# Patient Record
Sex: Female | Born: 2000
Health system: Southern US, Community
[De-identification: ages and names within clinical notes are randomized; demographics above are authoritative.]

## PROBLEM LIST (undated history)

## (undated) DIAGNOSIS — T7840XA Allergy, unspecified, initial encounter: Secondary | ICD-10-CM

## (undated) DIAGNOSIS — R519 Headache, unspecified: Secondary | ICD-10-CM

## (undated) DIAGNOSIS — F909 Attention-deficit hyperactivity disorder, unspecified type: Secondary | ICD-10-CM

## (undated) HISTORY — DX: Attention-deficit hyperactivity disorder, unspecified type: F90.9

## (undated) HISTORY — DX: Headache, unspecified: R51.9

## (undated) HISTORY — DX: Allergy, unspecified, initial encounter: T78.40XA

## (undated) HISTORY — PX: OPTIC NERVE DECOMPRESSION: SHX365

---
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
## 2022-01-24 ENCOUNTER — Ambulatory Visit: Payer: 59 | Admitting: Allergy

## 2022-07-03 ENCOUNTER — Telehealth: Payer: Self-pay | Admitting: Family Medicine

## 2022-07-03 ENCOUNTER — Telehealth: Payer: Self-pay

## 2022-07-03 NOTE — Telephone Encounter (Signed)
Norma Cooke's mom called the nurseline requesting an ADHD diagnosis report for her study abroad for her school. She now has a new PCP and was last seen by [place] in 2023. Is this something we can help her with?

## 2022-07-03 NOTE — Telephone Encounter (Signed)
Norma Cooke is needing a letter stating her ADHD diagnosis and accommodations needed. She contacted the office of the Dr. (Dr. Nani Gasser officially diagnosed her with ADHD, however that provider has since left and she was informed to contact her PCP. She asked them althought that office has her official diagnosis information she would still need to contact Dr. Claiborne Billings and they told her yes. . She is asking if Dr. Claiborne Billings or another approved provider can provide a letter for her university so that she can travel/ study aboard. Her current accommodations her university provides are double time for testing , food, water, breaks as needed and medication. She also has the ability to access lecture notes or slides prior to the lesson being taught. She is needing this information before May 31st. I informed the patient unfortunately Dr. Claiborne Billings is out of the office this week, but I would send this message to see if anything can be done to accommodate the deadline.

## 2022-07-03 NOTE — Telephone Encounter (Signed)
Spoke with patient regarding results/recommendations. Advised pt that she may be able to use her last office note and previous letter dated 06/2021 until we get approval from PCP to write a new letter with specifics.  Please advise if okay to form new letter

## 2022-07-05 ENCOUNTER — Ambulatory Visit: Payer: 59 | Admitting: Family Medicine

## 2022-07-05 ENCOUNTER — Telehealth: Payer: Self-pay

## 2022-07-05 ENCOUNTER — Encounter: Payer: Self-pay | Admitting: Family Medicine

## 2022-07-05 VITALS — BP 90/64 | HR 87 | Temp 98.7°F | Ht 67.0 in | Wt 132.5 lb

## 2022-07-05 DIAGNOSIS — B889 Infestation, unspecified: Secondary | ICD-10-CM | POA: Diagnosis not present

## 2022-07-05 MED ORDER — TRIAMCINOLONE ACETONIDE 0.1 % EX CREA: 1.0000 | TOPICAL_CREAM | Freq: Two times a day (BID) | CUTANEOUS | 0 refills | Status: DC

## 2022-07-05 NOTE — Progress Notes (Signed)
   Acute Office Visit   Subjective:  Patient ID: Norma Cooke, female    DOB: 2000-10-20, 22 y.o.   MRN: [id number]  Chief Complaint  Patient presents with   Rash    Pt is here today with bites Pt reports she was sitting on sofa at coffee shop. Pt reports this are itching  Showed up 07/04/2022 Pt reports she with friend and both of them have the same bites    HPI Patient is a 22 year old female that presents with bites and hives that are itching. She reports she slept on sofa with a friend and both of them have the same rash.   Symptoms started on 07/04/2022.  Review of Systems  Skin:  Positive for rash.   See HPI above      Objective:   BP 90/64   Pulse 87   Temp 98.7 F (37.1 C)   Ht 5\' 7"  (1.702 m)   Wt 132 lb 8 oz (60.1 kg)   SpO2 98%   BMI 20.75 kg/m    Physical Exam Vitals reviewed.  Constitutional:      General: She is not in acute distress.    Appearance: Normal appearance. She is not ill-appearing, toxic-appearing or diaphoretic.  Eyes:     General:        Right eye: No discharge.        Left eye: No discharge.     Conjunctiva/sclera: Conjunctivae normal.  Cardiovascular:     Rate and Rhythm: Normal rate and regular rhythm.     Heart sounds: Normal heart sounds. No murmur heard.    No friction rub. No gallop.  Pulmonary:     Effort: Pulmonary effort is normal. No respiratory distress.     Breath sounds: Normal breath sounds.  Musculoskeletal:        General: Normal range of motion.  Skin:    General: Skin is warm and dry.     Findings: Rash (Large inflammed whelps in a linear line or cluster on her left posterior upper arm, left lower back, and a few scattered on her upper leg.) present.  Neurological:     General: No focal deficit present.     Mental Status: She is alert and oriented to person, place, and time. Mental status is at baseline.  Psychiatric:        Mood and Affect: Mood normal.        Behavior: Behavior normal.        Thought  Content: Thought content normal.        Judgment: Judgment normal.       Assessment & Plan:  Parasitic dermatitis -     Triamcinolone Acetonide; Apply 1 Application topically 2 (two) times daily.  Dispense: 30 g; Refill: 0  -Prescribed Triamcinolone cream. Apply to the area twice a day.  -Recommend to clean all sheets and clothing in warm water.  -May continue with Zyrtec as an anti-histamine.   Zandra Abts, NP

## 2022-07-05 NOTE — Patient Instructions (Addendum)
-  START Triamcinolone cream. Apply to the area twice a day.  -Recommend to clean all sheets and clothing in warm water.  -May continue with Zyrtec as an anti-histamine.

## 2022-07-05 NOTE — Telephone Encounter (Signed)
Mom lvm requesting a copy of [name]'s ADHD dx. This is needed ASAP. Please call mom re: pickup.

## 2022-07-08 ENCOUNTER — Ambulatory Visit (INDEPENDENT_AMBULATORY_CARE_PROVIDER_SITE_OTHER): Payer: 59 | Admitting: Family Medicine

## 2022-07-08 ENCOUNTER — Encounter: Payer: Self-pay | Admitting: Family Medicine

## 2022-07-08 VITALS — BP 107/70 | HR 86 | Temp 98.5°F | Wt 132.8 lb

## 2022-07-08 DIAGNOSIS — F419 Anxiety disorder, unspecified: Secondary | ICD-10-CM | POA: Diagnosis not present

## 2022-07-08 DIAGNOSIS — F0781 Postconcussional syndrome: Secondary | ICD-10-CM | POA: Diagnosis not present

## 2022-07-08 DIAGNOSIS — F900 Attention-deficit hyperactivity disorder, predominantly inattentive type: Secondary | ICD-10-CM | POA: Diagnosis not present

## 2022-07-08 MED ORDER — METHYLPHENIDATE HCL 20 MG PO TABS: 20.0000 mg | ORAL_TABLET | Freq: Two times a day (BID) | ORAL | 0 refills | Status: DC

## 2022-07-08 MED ORDER — SERTRALINE HCL 100 MG PO TABS: 100.0000 mg | ORAL_TABLET | Freq: Two times a day (BID) | ORAL | 1 refills | Status: DC

## 2022-07-08 NOTE — Progress Notes (Signed)
Patient ID: Norma Cooke, female  DOB: 04/26/2000, 22 y.o.   MRN: [id number] Patient Care Team    Relationship Specialty Notifications Start End  Natalia Leatherwood, DO PCP - General Family Medicine  09/28/21   Andrena Mews, DO Referring Physician Sports Medicine  09/28/21   Fredrich Birks, OD Referring Physician Optometry  09/28/21   Tonie Griffith, MD Referring Physician Neurology  09/28/21   Marcelyn Bruins, MD Consulting Physician Allergy  09/28/21   Owens Shark, MD Consulting Physician Pediatrics  09/28/21     Chief Complaint  Patient presents with   Anxiety    Adhd; needs letter for customs for study abroad     Subjective: Norma Cooke is a 22 y.o.  female present for [facility] follow up ADHD/anxiety: She has been prescribed methylphenidate 20 mg twice daily and would like to continue.  She has settled into college in [place].  She is desiring is to have her medications locally and her family will pick up and mailed to her. Zoloft for anxiety and methylphenidate are working well for her. Prior note: She reports she has had multiple concussions since 2013.  She is established with neurology who prescribes Namenda, Imitrex, Aimovig for her history of postconcussion syndrome and chronic headaches.  She plans to remain established with neurology for management.     07/05/2022    3:48 PM 11/26/2021   12:55 PM 06/12/2021    2:36 PM  Depression screen PHQ 2/9  Decreased Interest 0 0 0  Down, Depressed, Hopeless 0 0 0  PHQ - 2 Score 0 0 0  Altered sleeping 0  0  Tired, decreased energy 0  0  Change in appetite 0  0  Feeling bad or failure about yourself  0  0  Trouble concentrating 0  0  Moving slowly or fidgety/restless 0  0  Suicidal thoughts 0    PHQ-9 Score 0  0  Difficult doing work/chores Not difficult at all        07/05/2022    3:48 PM 06/12/2021    2:36 PM  GAD 7 : Generalized Anxiety Score  Nervous, Anxious, on Edge 0 0  Control/stop  worrying 0 0  Worry too much - different things  0  Trouble relaxing 0 0  Restless 0 0  Easily annoyed or irritable 0 0  Afraid - awful might happen 0 0  Total GAD 7 Score  0  Anxiety Difficulty Not difficult at all            07/05/2022    3:48 PM 11/26/2021   12:54 PM  Fall Risk   Falls in the past year? 0 0  Number falls in past yr: 0 0  Injury with Fall? 0 0  Risk for fall due to : No Fall Risks   Follow up Falls evaluation completed Falls evaluation completed     Immunization History  Administered Date(s) Administered   DTaP 11/02/2002, 12/27/2002, 02/28/2003, 12/05/2003, 10/30/2007   HIB (PRP-OMP) 11/02/2002, 12/27/2002, 02/28/2003, 12/05/2003   Hepatitis A 08/26/2005, 08/25/2006   Hepatitis B 04/20/02, 09/29/2002, 05/25/2003   IPV 11/02/2002, 12/27/2002, 05/25/2003, 10/30/2007   Influenza,inj,Quad PF,6+ Mos 09/28/2021   MMR 09/02/2003, 10/30/2007   PFIZER(Purple Top)SARS-COV-2 Vaccination 04/08/2019, 05/01/2019, 01/24/2020   Pfizer Covid-19 Vaccine Bivalent Booster 12yrs & up 10/13/2020   Pneumococcal Conjugate-13 11/02/2002, 12/27/2002, 02/28/2003, 09/02/2003   Tdap 09/27/2013   Varicella 09/02/2003, 10/30/2007    No results found.  Past  Medical History:  Diagnosis Date   ADHD    Allergies    Frequent headaches    Gastroesophageal reflux disease 12/02/2019   Migraine without aura and with status migrainosus, not intractable 03/07/2014   Odynophagia 12/13/2019   Allergies  Allergen Reactions   Other Other (See Comments)   Pollen Extract Other (See Comments)   Past Surgical History:  Procedure Laterality Date   OPTIC NERVE DECOMPRESSION     Family History  Problem Relation Age of Onset   Hyperlipidemia Mother    Migraines Mother    Hearing loss Mother    Miscarriages / [place] Mother    Hyperlipidemia Father    Stroke Maternal Grandmother    Hyperlipidemia Maternal Grandmother    Cancer - Colon Maternal Grandfather    Hearing loss  Maternal Grandfather    Arthritis Maternal Grandfather    GER disease Maternal Grandfather    Early death Paternal Grandfather    Depression Other    Schizophrenia Other    Depression Other    Social History   Social History Narrative   Marital status/children/pets: Single   Education/employment: attends [employer]      -smoke alarm in the home:Yes     - wears seatbelt: Yes     - Feels safe in their relationships: Yes       Allergies as of 07/08/2022       Reactions   Other Other (See Comments)   Pollen Extract Other (See Comments)        Medication List        Accurate as of July 08, 2022 11:14 AM. If you have any questions, ask your nurse or doctor.          STOP taking these medications    acetaminophen-codeine 300-60 MG tablet Commonly known as: TYLENOL #4 Stopped by: Felix Pacini, DO   Aimovig 140 MG/ML Soaj Generic drug: Erenumab-aooe Stopped by: Felix Pacini, DO   chlorhexidine 0.12 % solution Commonly known as: PERIDEX Stopped by: Felix Pacini, DO   fexofenadine 60 MG tablet Commonly known as: ALLEGRA Stopped by: Felix Pacini, DO   meloxicam 7.5 MG tablet Commonly known as: Mobic Stopped by: Felix Pacini, DO   NAPROXEN PO Stopped by: Felix Pacini, DO   ondansetron 4 MG disintegrating tablet Commonly known as: ZOFRAN-ODT Stopped by: Felix Pacini, DO   Pulmicort Flexhaler 180 MCG/ACT inhaler Generic drug: budesonide Stopped by: Felix Pacini, DO       TAKE these medications    memantine 10 MG tablet Commonly known as: NAMENDA Take 10 mg by mouth 2 (two) times daily.   methylphenidate 20 MG tablet Commonly known as: RITALIN Take 1 tablet (20 mg total) by mouth 2 (two) times daily. What changed: Another medication with the same name was changed. Make sure you understand how and when to take each. Changed by: Felix Pacini, DO   methylphenidate 20 MG tablet Commonly known as: Ritalin Take 1 tablet (20 mg total) by  mouth 2 (two) times daily. Start taking on: September 30, 2022 What changed: These instructions start on September 30, 2022. If you are unsure what to do until then, ask your doctor or other care provider. Changed by: Felix Pacini, DO   montelukast 10 MG tablet Commonly known as: SINGULAIR Take 1 tablet by mouth daily.   naproxen sodium 220 MG tablet Commonly known as: ALEVE Take 550 mg by mouth daily as needed.   Nurtec 75 MG Tbdp Generic drug: Rimegepant Sulfate  Take by mouth as needed.   Qulipta 60 MG Tabs Generic drug: Atogepant Take by mouth daily.   sertraline 100 MG tablet Commonly known as: Zoloft Take 1 tablet (100 mg total) by mouth 2 (two) times daily.   SUMAtriptan 100 MG tablet Commonly known as: IMITREX Take 100 mg by mouth.   TRETINOIN EX Apply topically.   triamcinolone cream 0.1 % Commonly known as: KENALOG Apply 1 Application topically 2 (two) times daily.        All past medical history, surgical history, allergies, family history, immunizations andmedications were updated in the EMR today and reviewed under the history and medication portions of their EMR.    No results found for this or any previous visit (from the past 2160 hour(s)).  No results found.   ROS 14 pt review of systems performed and negative (unless mentioned in an HPI)  Objective: BP 107/70   Pulse 86   Temp 98.5 F (36.9 C)   Wt 132 lb 12.8 oz (60.2 kg)   LMP 06/14/2022   SpO2 98%   BMI 20.80 kg/m  Physical Exam Vitals and nursing note reviewed.  Constitutional:      General: She is not in acute distress.    Appearance: Normal appearance. She is normal weight. She is not ill-appearing or toxic-appearing.  Eyes:     Extraocular Movements: Extraocular movements intact.     Conjunctiva/sclera: Conjunctivae normal.     Pupils: Pupils are equal, round, and reactive to light.  Cardiovascular:     Rate and Rhythm: Normal rate and regular rhythm.  Neurological:     Mental  Status: She is alert and oriented to person, place, and time. Mental status is at baseline.  Psychiatric:        Mood and Affect: Mood normal.        Behavior: Behavior normal.        Thought Content: Thought content normal.        Judgment: Judgment normal.      Assessment/plan: HANIFA STILLSON is a 22 y.o. female present for  Attention deficit hyperactivity disorder (ADHD), predominantly inattentive type [place] controlled substance database reviewed 07/08/22 Continue methylphenidate 20 mg twice daily. #180 -Patient made aware that controlled substances cannot be transferred between pharmacies.   - She is also uncertain if she is able to receive a 90-day prescription with her insurance.  Called in 90-day today in hopes that it works out better for her.  She will call us back if they do not give her a 90-day prescription.  Letter drafted to allow her to travel with her medications when she stays abroad to the [place].  Anxiety: Stable  Continue Zoloft 100 mg BID.    Postconcussion syndrome/Chronic post-traumatic headache, not intractable Continue follow-up with neurology who prescribes Namenda, Aimovig and Imitrex.  Return in about 24 weeks (around 12/23/2022) for Routine chronic condition follow-up.  No orders of the defined types were placed in this encounter.  Meds ordered this encounter  Medications   methylphenidate (RITALIN) 20 MG tablet    Sig: Take 1 tablet (20 mg total) by mouth 2 (two) times daily.    Dispense:  180 tablet    Refill:  0   methylphenidate (RITALIN) 20 MG tablet    Sig: Take 1 tablet (20 mg total) by mouth 2 (two) times daily.    Dispense:  180 tablet    Refill:  0   sertraline (ZOLOFT) 100 MG tablet    Sig:  Take 1 tablet (100 mg total) by mouth 2 (two) times daily.    Dispense:  180 tablet    Refill:  1   Referral Orders  No referral(s) requested today     Note is dictated utilizing voice recognition software. Although note has been  proof read prior to signing, occasional typographical errors still can be missed. If any questions arise, please do not hesitate to call for verification.  Electronically signed by: Felix Pacini, DO [facility]

## 2022-07-08 NOTE — Patient Instructions (Signed)
No follow-ups on file.        Great to see you today.  I have refilled the medication(s) we provide.   If labs were collected, we will inform you of lab results once received either by echart message or telephone call.   - echart message- for normal results that have been seen by the patient already.   - telephone call: abnormal results or if patient has not viewed results in their echart.  

## 2022-07-08 NOTE — Telephone Encounter (Signed)
Ok to draft letter for her 

## 2022-07-08 NOTE — Telephone Encounter (Signed)
Letter sent via My Chart.

## 2022-10-02 ENCOUNTER — Encounter: Payer: Self-pay | Admitting: Podiatry

## 2022-10-02 ENCOUNTER — Ambulatory Visit (INDEPENDENT_AMBULATORY_CARE_PROVIDER_SITE_OTHER): Payer: 59 | Admitting: Podiatry

## 2022-10-02 DIAGNOSIS — M722 Plantar fascial fibromatosis: Secondary | ICD-10-CM | POA: Diagnosis not present

## 2022-10-02 NOTE — Progress Notes (Signed)
She presents today for follow-up of her [place] fasciitis states that the orthotics are doing great when I wear them she states that I do a lot of walking and I will get sharp pain that here as she points to the lateral aspect of her bilateral feet.  She states that it really only hurts when her shoes are on does not bother her in bed or resting.  Objective: Vital signs are stable alert and oriented x 3.  There is no erythema edema salines drainage or odor she has tenderness on palpation of the fourth fifth intermetatarsal ligament distally and irritation on the lateral aspect of the bilateral foot.  I evaluated her shoes orthotics were in her shoes.  She has a curved left shoe with a very straight foot.  Assessment plantar fasciitis utilizing orthotics symptoms currently may be caused by shoe gear.  Plan: Educated her on shoe gear and should this not alleviate her symptoms she will follow-up with [place].

## 2023-01-20 ENCOUNTER — Encounter: Payer: Self-pay | Admitting: Family Medicine

## 2023-01-20 ENCOUNTER — Ambulatory Visit: Payer: 59 | Admitting: Family Medicine

## 2023-01-20 ENCOUNTER — Ambulatory Visit (INDEPENDENT_AMBULATORY_CARE_PROVIDER_SITE_OTHER): Payer: 59 | Admitting: Family Medicine

## 2023-01-20 VITALS — BP 110/64 | HR 87 | Temp 98.2°F | Wt 130.4 lb

## 2023-01-20 DIAGNOSIS — Z23 Encounter for immunization: Secondary | ICD-10-CM | POA: Diagnosis not present

## 2023-01-20 DIAGNOSIS — F900 Attention-deficit hyperactivity disorder, predominantly inattentive type: Secondary | ICD-10-CM

## 2023-01-20 DIAGNOSIS — F419 Anxiety disorder, unspecified: Secondary | ICD-10-CM

## 2023-01-20 MED ORDER — SERTRALINE HCL 100 MG PO TABS: 100.0000 mg | ORAL_TABLET | Freq: Two times a day (BID) | ORAL | 1 refills | Status: DC

## 2023-01-20 MED ORDER — METHYLPHENIDATE HCL 20 MG PO TABS: 20.0000 mg | ORAL_TABLET | Freq: Two times a day (BID) | ORAL | 0 refills | Status: DC

## 2023-01-20 NOTE — Progress Notes (Signed)
Patient ID: Norma Cooke, female  DOB: [birth year], 22 y.o.   MRN: [id number] Patient Care Team    Relationship Specialty Notifications Start End  Natalia Leatherwood, DO PCP - General Family Medicine  09/28/21   Andrena Mews, DO Referring Physician Sports Medicine  09/28/21   Fredrich Birks, OD Referring Physician Optometry  09/28/21   Tonie Griffith, MD Referring Physician Neurology  09/28/21   Marcelyn Bruins, MD Consulting Physician Allergy  09/28/21   Owens Shark, MD Consulting Physician Pediatrics  09/28/21     Chief Complaint  Patient presents with   ADHD    Subjective: Norma Cooke is a 22 y.o.  female present for [facility] follow up ADHD/anxiety: She reports compliance methylphenidate 20 mg twice daily and would like to continue.  She has settled into college in [place].  She is desiring is to have her medications locally and her family will pick up and mailed to her. Zoloft 100 mg BID for anxiety and methylphenidate are working well for her Prior note: She reports she has had multiple concussions since 2013.  She is established with neurology who prescribes Namenda, Imitrex, Aimovig for her history of postconcussion syndrome and chronic headaches.  She plans to remain established with neurology for management.     07/05/2022    3:48 PM 11/26/2021   12:55 PM 06/12/2021    2:36 PM  Depression screen PHQ 2/9  Decreased Interest 0 0 0  Down, Depressed, Hopeless 0 0 0  PHQ - 2 Score 0 0 0  Altered sleeping 0  0  Tired, decreased energy 0  0  Change in appetite 0  0  Feeling bad or failure about yourself  0  0  Trouble concentrating 0  0  Moving slowly or fidgety/restless 0  0  Suicidal thoughts 0    PHQ-9 Score 0  0  Difficult doing work/chores Not difficult at all        07/05/2022    3:48 PM 06/12/2021    2:36 PM  GAD 7 : Generalized Anxiety Score  Nervous, Anxious, on Edge 0 0  Control/stop worrying 0 0  Worry too much - different things  0   Trouble relaxing 0 0  Restless 0 0  Easily annoyed or irritable 0 0  Afraid - awful might happen 0 0  Total GAD 7 Score  0  Anxiety Difficulty Not difficult at all            07/05/2022    3:48 PM 11/26/2021   12:54 PM  Fall Risk   Falls in the past year? 0 0  Number falls in past yr: 0 0  Injury with Fall? 0 0  Risk for fall due to : No Fall Risks   Follow up Falls evaluation completed Falls evaluation completed     Immunization History  Administered Date(s) Administered   DTaP 11/02/2002, 12/27/2002, 02/28/2003, 12/05/2003, 10/30/2007   HIB (PRP-OMP) 11/02/2002, 12/27/2002, 02/28/2003, 12/05/2003   Hepatitis A 08/26/2005, 08/25/2006   Hepatitis B 28-May-2002, 09/29/2002, 05/25/2003   IPV 11/02/2002, 12/27/2002, 05/25/2003, 10/30/2007   Influenza,inj,Quad PF,6+ Mos 09/28/2021   Influenza-Unspecified 11/05/2022   MMR 09/02/2003, 10/30/2007   PFIZER(Purple Top)SARS-COV-2 Vaccination 04/08/2019, 05/01/2019, 01/24/2020   Pfizer Covid-19 Vaccine Bivalent Booster 55yrs & up 10/13/2020   Pneumococcal Conjugate-13 11/02/2002, 12/27/2002, 02/28/2003, 09/02/2003   Tdap 09/27/2013   Varicella 09/02/2003, 10/30/2007    No results found.  Past Medical History:  Diagnosis Date  ADHD    Allergies    Frequent headaches    Gastroesophageal reflux disease 12/02/2019   Migraine without aura and with status migrainosus, not intractable 03/07/2014   Odynophagia 12/13/2019   Allergies  Allergen Reactions   Other Other (See Comments)   Pollen Extract Other (See Comments)   Past Surgical History:  Procedure Laterality Date   OPTIC NERVE DECOMPRESSION     Family History  Problem Relation Age of Onset   Hyperlipidemia Mother    Migraines Mother    Hearing loss Mother    Miscarriages / [place] Mother    Hyperlipidemia Father    Stroke Maternal Grandmother    Hyperlipidemia Maternal Grandmother    Cancer - Colon Maternal Grandfather    Hearing loss Maternal Grandfather     Arthritis Maternal Grandfather    GER disease Maternal Grandfather    Early death Paternal Grandfather    Depression Other    Schizophrenia Other    Depression Other    Social History   Social History Narrative   Marital status/children/pets: Single   Education/employment: attends college-student   Safety:      -smoke alarm in the home:Yes     - wears seatbelt: Yes     - Feels safe in their relationships: Yes       Allergies as of 01/20/2023       Reactions   Other Other (See Comments)   Pollen Extract Other (See Comments)        Medication List        Accurate as of January 20, 2023  1:41 PM. If you have any questions, ask your nurse or doctor.          cetirizine 10 MG tablet Commonly known as: ZYRTEC Take 10 mg by mouth in the morning and at bedtime.   clonazepam 0.125 MG disintegrating tablet Commonly known as: KLONOPIN Take by mouth 2 (two) times daily.   fluticasone 50 MCG/ACT nasal spray Commonly known as: FLONASE Place into both nostrils daily.   memantine 10 MG tablet Commonly known as: NAMENDA Take 10 mg by mouth 2 (two) times daily.   methylphenidate 20 MG tablet Commonly known as: Ritalin Take 1 tablet (20 mg total) by mouth 2 (two) times daily. What changed: Another medication with the same name was changed. Make sure you understand how and when to take each. Changed by: Felix Pacini   methylphenidate 20 MG tablet Commonly known as: RITALIN Take 1 tablet (20 mg total) by mouth 2 (two) times daily. Start taking on: March 25, 2023 What changed: These instructions start on March 25, 2023. If you are unsure what to do until then, ask your doctor or other care provider. Changed by: Felix Pacini   montelukast 10 MG tablet Commonly known as: SINGULAIR Take 1 tablet by mouth daily.   naproxen sodium 220 MG tablet Commonly known as: ALEVE Take 550 mg by mouth daily as needed.   Nurtec 75 MG Tbdp Generic drug: Rimegepant  Sulfate Take by mouth as needed.   ondansetron 4 MG disintegrating tablet Commonly known as: ZOFRAN-ODT Take 4 mg by mouth 2 (two) times daily as needed for nausea or vomiting.   Qulipta 60 MG Tabs Generic drug: Atogepant Take by mouth. What changed: Another medication with the same name was removed. Continue taking this medication, and follow the directions you see here. Changed by: Felix Pacini   sertraline 100 MG tablet Commonly known as: Zoloft Take 1 tablet (100 mg total) by  mouth 2 (two) times daily.   SUMAtriptan 100 MG tablet Commonly known as: IMITREX Take 100 mg by mouth.   TRETINOIN EX Apply topically.   triamcinolone cream 0.1 % Commonly known as: KENALOG Apply 1 Application topically 2 (two) times daily.        All past medical history, surgical history, allergies, family history, immunizations andmedications were updated in the EMR today and reviewed under the history and medication portions of their EMR.    No results found for this or any previous visit (from the past 2160 hours).  No results found.   ROS 14 pt review of systems performed and negative (unless mentioned in an HPI)  Objective: BP 110/64   Pulse 87   Temp 98.2 F (36.8 C)   Wt 130 lb 6.4 oz (59.1 kg)   SpO2 97%   BMI 20.42 kg/m  Physical Exam Vitals and nursing note reviewed.  Constitutional:      General: She is not in acute distress.    Appearance: Normal appearance. She is normal weight. She is not ill-appearing or toxic-appearing.  Eyes:     Extraocular Movements: Extraocular movements intact.     Conjunctiva/sclera: Conjunctivae normal.     Pupils: Pupils are equal, round, and reactive to light.  Cardiovascular:     Rate and Rhythm: Normal rate and regular rhythm.  Neurological:     Mental Status: She is alert and oriented to person, place, and time. Mental status is at baseline.  Psychiatric:        Mood and Affect: Mood normal.        Behavior: Behavior normal.         Thought Content: Thought content normal.        Judgment: Judgment normal.      Assessment/plan: Norma Cooke is a 22 y.o. female present for  Attention deficit hyperactivity disorder (ADHD), predominantly inattentive type [place] controlled substance database reviewed 01/20/23 Continue methylphenidate 20 mg twice daily. #180 -Patient made aware that controlled substances cannot be transferred between pharmacies.   - She is also uncertain if she is able to receive a 90-day prescription with her insurance.  Called in 90-day today in hopes that it works out better for her.  She will call us back if they do not give her a 90-day prescription.  Anxiety: Stable  Continue  Zoloft 100 mg BID.    Postconcussion syndrome/Chronic post-traumatic headache, not intractable Continue follow-up with neurology who prescribes Namenda, Aimovig and Imitrex.  Return in about 24 weeks (around 07/07/2023) for Routine chronic condition follow-up.  No orders of the defined types were placed in this encounter.  Meds ordered this encounter  Medications   methylphenidate (RITALIN) 20 MG tablet    Sig: Take 1 tablet (20 mg total) by mouth 2 (two) times daily.    Dispense:  180 tablet    Refill:  0   methylphenidate (RITALIN) 20 MG tablet    Sig: Take 1 tablet (20 mg total) by mouth 2 (two) times daily.    Dispense:  180 tablet    Refill:  0   sertraline (ZOLOFT) 100 MG tablet    Sig: Take 1 tablet (100 mg total) by mouth 2 (two) times daily.    Dispense:  180 tablet    Refill:  1   Referral Orders  No referral(s) requested today     Note is dictated utilizing voice recognition software. Although note has been proof read prior to signing, occasional typographical  errors still can be missed. If any questions arise, please do not hesitate to call for verification.  Electronically signed by: Felix Pacini, DO [facility] [place]

## 2023-01-20 NOTE — Patient Instructions (Addendum)

## 2023-06-09 ENCOUNTER — Telehealth: Payer: Self-pay | Admitting: Family Medicine

## 2023-06-09 NOTE — Telephone Encounter (Signed)
 Copied from CRM [id number]. Topic: Clinical - Medication Refill >> Jun 09, 2023  1:56 PM [name] wrote: Most Recent Primary Care Visit:  Provider: Napolean Backbone A  Department: [facility]  Visit Type: OFFICE VISIT  Date: 01/20/2023  Medication: Pt Will be DC in the summer for research pt requesting to see if 84month supply can be prescribed.  Lisdexamfetamine medication not on the list   Has the patient contacted their pharmacy? No (Agent: If no, request that the patient contact the pharmacy for the refill. If patient does not wish to contact the pharmacy document the reason why and proceed with request.) (Agent: If yes, when and what did the pharmacy advise?)  Is this the correct pharmacy for this prescription? Yes If no, delete pharmacy and type the correct one.  This is the patient's preferred pharmacy:  [id number] - [place], [place] - [address] AT [facility] [address] [place] [address] Phone: [phone] Fax: [phone]  [facility] [place] - [id number] [phone] [address] [place] [address] Phone: [phone] Fax: [phone]  [id number] - [place], [place] - [address] AT [facility] [address] [facility] [address] [place] [address] Phone: [phone] Fax: [phone]  [facility] [address] [place] - [address] [place] [address] Phone: [phone] Fax: [phone]   Has the prescription been filled recently? Yes  Is the patient out of the medication? No  Has the patient been seen for an appointment in the last year OR does the patient have an upcoming appointment? Yes  Can we respond through MyChart? No  Agent: Please be advised that Rx refills may take up to 3 business days. We ask that you follow-up with your pharmacy.

## 2023-06-09 NOTE — Telephone Encounter (Signed)
 Patient was last seen for this condition in December 2024.  She is due for follow-up I cannot call in her controlled substance without seeing her face-to-face.  She is asking for 90-day prescriptions moving forward from her scheduled appointment at the end of this month, she will need to check with her pharmacy and her insurance company.  90-day prescriptions for controlled substances are only allowed at certain pharmacies, and that is dictated by her insurance, not this provider.  As far as I am concerned, at her appointment we can call in 90-day prescriptions

## 2023-06-09 NOTE — Telephone Encounter (Signed)
LVM for pt. Sent My Chart.

## 2023-06-24 ENCOUNTER — Ambulatory Visit (INDEPENDENT_AMBULATORY_CARE_PROVIDER_SITE_OTHER): Payer: 59 | Admitting: Family Medicine

## 2023-06-24 DIAGNOSIS — F900 Attention-deficit hyperactivity disorder, predominantly inattentive type: Secondary | ICD-10-CM

## 2023-06-24 DIAGNOSIS — Z91199 Patient's noncompliance with other medical treatment and regimen due to unspecified reason: Secondary | ICD-10-CM

## 2023-06-24 DIAGNOSIS — F419 Anxiety disorder, unspecified: Secondary | ICD-10-CM

## 2023-06-24 NOTE — Progress Notes (Signed)
 No show

## 2023-06-25 ENCOUNTER — Ambulatory Visit (INDEPENDENT_AMBULATORY_CARE_PROVIDER_SITE_OTHER): Admitting: Family Medicine

## 2023-06-25 ENCOUNTER — Encounter: Payer: Self-pay | Admitting: Family Medicine

## 2023-06-25 VITALS — BP 104/70 | HR 83 | Temp 98.3°F | Wt 128.8 lb

## 2023-06-25 DIAGNOSIS — F900 Attention-deficit hyperactivity disorder, predominantly inattentive type: Secondary | ICD-10-CM

## 2023-06-25 DIAGNOSIS — G44329 Chronic post-traumatic headache, not intractable: Secondary | ICD-10-CM

## 2023-06-25 DIAGNOSIS — F0781 Postconcussional syndrome: Secondary | ICD-10-CM | POA: Diagnosis not present

## 2023-06-25 DIAGNOSIS — F419 Anxiety disorder, unspecified: Secondary | ICD-10-CM | POA: Diagnosis not present

## 2023-06-25 MED ORDER — SERTRALINE HCL 100 MG PO TABS: 100.0000 mg | ORAL_TABLET | Freq: Two times a day (BID) | ORAL | 1 refills | Status: DC

## 2023-06-25 MED ORDER — LISDEXAMFETAMINE DIMESYLATE 40 MG PO CAPS: 40.0000 mg | ORAL_CAPSULE | ORAL | 0 refills | Status: DC

## 2023-06-25 NOTE — Patient Instructions (Signed)
 Return in about 6 months (around 12/15/2023).        Great to see you today.  I have refilled the medication(s) we provide.   If labs were collected or images ordered, we will inform you of  results once we have received them and reviewed. We will contact you either by echart message, or telephone call.  Please give ample time to the testing facility, and our office to run,  receive and review results. Please do not call inquiring of results, even if you can see them in your chart. We will contact you as soon as we are able. If it has been over 1 week since the test was completed, and you have not yet heard from us , then please call us .    - echart message- for normal results that have been seen by the patient already.   - telephone call: abnormal results or if patient has not viewed results in their echart.  If a referral to a specialist was entered for you, please call us  in 2 weeks if you have not heard from the specialist office to schedule.

## 2023-06-25 NOTE — Progress Notes (Signed)
 Patient ID: Norma Cooke, female  DOB: 2000-03-15, 23 y.o.   MRN: [id number] Patient Care Team    Relationship Specialty Notifications Start End  Mariel Shope, DO PCP - General Family Medicine  09/28/21   Clyde Darling, DO Referring Physician Sports Medicine  09/28/21   Nelva Bang, OD Referring Physician Optometry  09/28/21   Pocock, Kristyn M, MD Referring Physician Neurology  09/28/21   Brian Campanile, MD Consulting Physician Allergy   09/28/21   Merribeth Abbott, MD (Inactive) Consulting Physician Pediatrics  09/28/21     Chief Complaint  Patient presents with   ADHD    Subjective: Norma Cooke is a 23 y.o.  female present for [facility] follow up ADHD/anxiety: She reports compliance with Vyvanse 40 mg daily and would like to continue.  This medication was changed from methylphenidate  20 mg by another provider in [place] .  She has settled into college in [place] .  She was desiring  to have her medications locally and her family will pick up and mailed to her.  However, she did see the provider in [place]  now.  Unfortunately, she will not be able to get back to [place]  to see that provider again until school starts back up in the fall. Zoloft  100 mg BID for anxiety and methylphenidate  are working well for her Prior note: She reports she has had multiple concussions since 2013.  She is established with neurology who prescribes Imitrex, Aimovig for her history of postconcussion syndrome and chronic headaches.  She plans to remain established with neurology for management.  Deena Kuruvilla - klonopin and new adhd med Vyvanse 40 mg a few months and working well for her. Started by NP     07/05/2022    3:48 PM 11/26/2021   12:55 PM 06/12/2021    2:36 PM  Depression screen PHQ 2/9  Decreased Interest 0 0 0  Down, Depressed, Hopeless 0 0 0  PHQ - 2 Score 0 0 0  Altered sleeping 0  0  Tired, decreased energy 0  0  Change in appetite 0  0  Feeling bad  or failure about yourself  0  0  Trouble concentrating 0  0  Moving slowly or fidgety/restless 0  0  Suicidal thoughts 0    PHQ-9 Score 0  0  Difficult doing work/chores Not difficult at all        07/05/2022    3:48 PM 06/12/2021    2:36 PM  GAD 7 : Generalized Anxiety Score  Nervous, Anxious, on Edge 0 0  Control/stop worrying 0 0  Worry too much - different things  0  Trouble relaxing 0 0  Restless 0 0  Easily annoyed or irritable 0 0  Afraid - awful might happen 0 0  Total GAD 7 Score  0  Anxiety Difficulty Not difficult at all            07/05/2022    3:48 PM 11/26/2021   12:54 PM  Fall Risk   Falls in the past year? 0 0  Number falls in past yr: 0 0  Injury with Fall? 0 0  Risk for fall due to : No Fall Risks   Follow up Falls evaluation completed Falls evaluation completed     Immunization History  Administered Date(s) Administered   DTaP 11/02/2002, 12/27/2002, 02/28/2003, 12/05/2003, 10/30/2007   HIB (PRP-OMP) 11/02/2002, 12/27/2002, 02/28/2003, 12/05/2003   Hepatitis A 08/26/2005, 08/25/2006   Hepatitis B 27-Dec-2002, 09/29/2002, 05/25/2003  IPV 11/02/2002, 12/27/2002, 05/25/2003, 10/30/2007   Influenza,inj,Quad PF,6+ Mos 09/28/2021   Influenza-Unspecified 11/05/2022   MMR 09/02/2003, 10/30/2007   PFIZER(Purple Top)SARS-COV-2 Vaccination 04/08/2019, 05/01/2019, 01/24/2020   Pfizer Covid-19 Vaccine Bivalent Booster 63yrs & up 10/13/2020   Pneumococcal Conjugate-13 11/02/2002, 12/27/2002, 02/28/2003, 09/02/2003   Tdap 09/27/2013   Varicella 09/02/2003, 10/30/2007    No results found.  Past Medical History:  Diagnosis Date   ADHD    Allergies    Frequent headaches    Gastroesophageal reflux disease 12/02/2019   Migraine without aura and with status migrainosus, not intractable 03/07/2014   Odynophagia 12/13/2019   Allergies  Allergen Reactions   Other Other (See Comments)   Pollen Extract Other (See Comments)   History reviewed. No pertinent  surgical history.  Family History  Problem Relation Age of Onset   Hyperlipidemia Mother    Migraines Mother    Hearing loss Mother    Miscarriages / [place] Mother    Hyperlipidemia Father    Stroke Maternal Grandmother    Hyperlipidemia Maternal Grandmother    Cancer - Colon Maternal Grandfather    Hearing loss Maternal Grandfather    Arthritis Maternal Grandfather    GER disease Maternal Grandfather    Early death Paternal Grandfather    Depression Other    Schizophrenia Other    Depression Other    Social History   Social History Narrative   Marital status/children/pets: Single   Education/employment: attends [employer]:      -smoke alarm in the home:Yes     - wears seatbelt: Yes     - Feels safe in their relationships: Yes       Allergies as of 06/25/2023       Reactions   Other Other (See Comments)   Pollen Extract Other (See Comments)        Medication List        Accurate as of Jun 25, 2023 11:59 PM. If you have any questions, ask your nurse or doctor.          STOP taking these medications    clonazepam 0.125 MG disintegrating tablet Commonly known as: KLONOPIN Stopped by: Ikeem Cleckler   memantine 10 MG tablet Commonly known as: NAMENDA Stopped by: Tywanna Seifer   montelukast 10 MG tablet Commonly known as: SINGULAIR Stopped by: Napolean Backbone       TAKE these medications    Botox 200 units injection Generic drug: botulinum toxin Type A   cetirizine 10 MG tablet Commonly known as: ZYRTEC Take 10 mg by mouth in the morning and at bedtime.   fluticasone  50 MCG/ACT nasal spray Commonly known as: FLONASE  Place into both nostrils daily.   ketoconazole 2 % shampoo Commonly known as: NIZORAL Use as a shampoo for the scalp Externally Once a day until clear, then as needed. Let sit for 3-5 minutes before rinsing.; Duration: 30 days   lisdexamfetamine 40 MG capsule Commonly known as: VYVANSE Take 40 mg by mouth  daily. What changed: Another medication with the same name was added. Make sure you understand how and when to take each. Changed by: Napolean Backbone   lisdexamfetamine 40 MG capsule Commonly known as: Vyvanse Take 1 capsule (40 mg total) by mouth every morning. What changed: You were already taking a medication with the same name, and this prescription was added. Make sure you understand how and when to take each. Changed by: Napolean Backbone   lisdexamfetamine 40 MG capsule Commonly known as: Vyvanse  Take 1 capsule (40 mg total) by mouth every morning. Start taking on: September 08, 2023 What changed: You were already taking a medication with the same name, and this prescription was added. Make sure you understand how and when to take each. Changed by: Napolean Backbone   methylphenidate  20 MG tablet Commonly known as: Ritalin  Take 1 tablet (20 mg total) by mouth 2 (two) times daily.   methylphenidate  20 MG tablet Commonly known as: RITALIN  Take 1 tablet (20 mg total) by mouth 2 (two) times daily.   naproxen sodium 220 MG tablet Commonly known as: ALEVE Take 550 mg by mouth daily as needed.   Nurtec 75 MG Tbdp Generic drug: Rimegepant Sulfate Take by mouth as needed.   ondansetron 4 MG disintegrating tablet Commonly known as: ZOFRAN-ODT Take 4 mg by mouth 2 (two) times daily as needed for nausea or vomiting.   Qulipta 60 MG Tabs Generic drug: Atogepant Take by mouth.   ropivacaine (PF) 5 mg/mL (0.5%) 5 MG/ML injection Commonly known as: NAROPIN 10 mg by Other route.   sertraline  100 MG tablet Commonly known as: Zoloft  Take 1 tablet (100 mg total) by mouth 2 (two) times daily.   SUMAtriptan 100 MG tablet Commonly known as: IMITREX Take 100 mg by mouth.   TRETINOIN EX Apply topically.        All past medical history, surgical history, allergies, family history, immunizations andmedications were updated in the EMR today and reviewed under the history and medication portions  of their EMR.    No results found for this or any previous visit (from the past 2160 hours).  No results found.   ROS 14 pt review of systems performed and negative (unless mentioned in an HPI)  Objective: BP 104/70   Pulse 83   Temp 98.3 F (36.8 C)   Wt 128 lb 12.8 oz (58.4 kg)   SpO2 99%   BMI 20.17 kg/m  Physical Exam Vitals and nursing note reviewed.  Constitutional:      General: She is not in acute distress.    Appearance: Normal appearance. She is normal weight. She is not ill-appearing or toxic-appearing.  Eyes:     Extraocular Movements: Extraocular movements intact.     Conjunctiva/sclera: Conjunctivae normal.     Pupils: Pupils are equal, round, and reactive to light.  Cardiovascular:     Rate and Rhythm: Normal rate and regular rhythm.  Neurological:     Mental Status: She is alert and oriented to person, place, and time. Mental status is at baseline.  Psychiatric:        Mood and Affect: Mood normal.        Behavior: Behavior normal.        Thought Content: Thought content normal.        Judgment: Judgment normal.      Assessment/plan: Norma Cooke is a 23 y.o. female present for  Attention deficit hyperactivity disorder (ADHD), predominantly inattentive type [place]  controlled substance database reviewed 06/26/23 Continue Lantus 40 mg daily#90, x2 scripts -Patient made aware that controlled substances cannot be transferred between pharmacies.   - Patient was made aware she will either need to receive her care here or in [place] , but not both, concerning her controlled substance prescriptions.  Patient reports understanding.  Anxiety: Stable Continue Zoloft  100 mg BID.    Postconcussion syndrome/Chronic post-traumatic headache, not intractable Continue follow-up with neurology who prescribes Aimovig and Imitrex.  Return in about 6 months (around 12/15/2023).  No  orders of the defined types were placed in this encounter.  Meds  ordered this encounter  Medications   sertraline  (ZOLOFT ) 100 MG tablet    Sig: Take 1 tablet (100 mg total) by mouth 2 (two) times daily.    Dispense:  180 tablet    Refill:  1   lisdexamfetamine (VYVANSE) 40 MG capsule    Sig: Take 1 capsule (40 mg total) by mouth every morning.    Dispense:  90 capsule    Refill:  0   lisdexamfetamine (VYVANSE) 40 MG capsule    Sig: Take 1 capsule (40 mg total) by mouth every morning.    Dispense:  90 capsule    Refill:  0   Referral Orders  No referral(s) requested today     Note is dictated utilizing voice recognition software. Although note has been proof read prior to signing, occasional typographical errors still can be missed. If any questions arise, please do not hesitate to call for verification.  Electronically signed by: Napolean Backbone, DO [facility] [place]

## 2023-09-01 ENCOUNTER — Encounter: Payer: Self-pay | Admitting: Family Medicine

## 2023-10-14 ENCOUNTER — Other Ambulatory Visit: Payer: Self-pay | Admitting: Family Medicine

## 2023-10-14 NOTE — Telephone Encounter (Unsigned)
 Copied from CRM [id number]. Topic: Clinical - Medication Refill >> Oct 14, 2023  8:06 AM Henretta I wrote: Medication: lisdexamfetamine (VYVANSE ) 40 MG capsule  Has the patient contacted their pharmacy? No (Agent: If no, request that the patient contact the pharmacy for the refill. If patient does not wish to contact the pharmacy document the reason why and proceed with request.) (Agent: If yes, when and what did the pharmacy advise?)  This is the patient's preferred pharmacy:  [facility]  [address] [place]  [address] Phone number: [phone]   Is this the correct pharmacy for this prescription? Yes If no, delete pharmacy and type the correct one.   Has the prescription been filled recently? No  Is the patient out of the medication? No, has around 6 days left   Has the patient been seen for an appointment in the last year OR does the patient have an upcoming appointment? Yes  Can we respond through MyChart? Yes  Agent: Please be advised that Rx refills may take up to 3 business days. We ask that you follow-up with your pharmacy.

## 2023-10-14 NOTE — Telephone Encounter (Signed)
 Requesting refill to out of state pharmacy.

## 2023-10-15 MED ORDER — LISDEXAMFETAMINE DIMESYLATE 40 MG PO CAPS: 40.0000 mg | ORAL_CAPSULE | Freq: Every day | ORAL | 0 refills | Status: DC

## 2023-10-15 NOTE — Telephone Encounter (Signed)
 Called pt. Unable to leave VM. message sent to mychart

## 2023-10-15 NOTE — Telephone Encounter (Signed)
 I sent vyvanse  into [place] pharmacy she requested. She will need to check with them, every STATE has laws surrounding the prescribing of these meds I discussed this at the time of her appt in May.  We discussed she may need to have her family send her scripts in the mail after picking them up at her pharmacy here.   She had a 2nd script sent to the local pharmacy here.   Any further script will require appt. I would recommend she make the appt for when she is here next

## 2024-01-26 ENCOUNTER — Ambulatory Visit: Admitting: Family Medicine

## 2024-01-27 ENCOUNTER — Encounter: Payer: Self-pay | Admitting: Family Medicine

## 2024-01-27 ENCOUNTER — Ambulatory Visit (INDEPENDENT_AMBULATORY_CARE_PROVIDER_SITE_OTHER): Admitting: Family Medicine

## 2024-01-27 DIAGNOSIS — Z91199 Patient's noncompliance with other medical treatment and regimen due to unspecified reason: Secondary | ICD-10-CM

## 2024-01-27 NOTE — Progress Notes (Signed)
 No show/no call.

## 2024-01-27 NOTE — Patient Instructions (Signed)

## 2024-02-02 ENCOUNTER — Encounter: Payer: Self-pay | Admitting: Family Medicine

## 2024-02-11 ENCOUNTER — Encounter: Payer: Self-pay | Admitting: Family Medicine

## 2024-02-11 ENCOUNTER — Ambulatory Visit: Admitting: Family Medicine

## 2024-02-11 VITALS — BP 104/74 | HR 108 | Temp 98.1°F | Wt 119.8 lb

## 2024-02-11 DIAGNOSIS — F419 Anxiety disorder, unspecified: Secondary | ICD-10-CM

## 2024-02-11 DIAGNOSIS — F900 Attention-deficit hyperactivity disorder, predominantly inattentive type: Secondary | ICD-10-CM | POA: Diagnosis not present

## 2024-02-11 DIAGNOSIS — F0781 Postconcussional syndrome: Secondary | ICD-10-CM

## 2024-02-11 MED ORDER — AMPHETAMINE-DEXTROAMPHETAMINE 10 MG PO TABS: ORAL_TABLET | ORAL | 0 refills | Status: AC

## 2024-02-11 MED ORDER — LISDEXAMFETAMINE DIMESYLATE 40 MG PO CAPS: 40.0000 mg | ORAL_CAPSULE | ORAL | 0 refills | Status: AC

## 2024-02-11 MED ORDER — LISDEXAMFETAMINE DIMESYLATE 40 MG PO CAPS: 40.0000 mg | ORAL_CAPSULE | Freq: Every day | ORAL | 0 refills | Status: AC

## 2024-02-11 MED ORDER — SERTRALINE HCL 100 MG PO TABS: 100.0000 mg | ORAL_TABLET | Freq: Two times a day (BID) | ORAL | 1 refills | Status: AC

## 2024-02-11 NOTE — Patient Instructions (Signed)
 Return in about 24 weeks (around 07/28/2024) for Routine chronic condition follow-up.        Great to see you today.  I have refilled the medication(s) we provide.   If labs were collected or images ordered, we will inform you of  results once we have received them and reviewed. We will contact you either by echart message, or telephone call.  Please give ample time to the testing facility, and our office to run,  receive and review results. Please do not call inquiring of results, even if you can see them in your chart. We will contact you as soon as we are able. If it has been over 1 week since the test was completed, and you have not yet heard from us , then please call us .    - echart message- for normal results that have been seen by the patient already.   - telephone call: abnormal results or if patient has not viewed results in their echart.  If a referral to a specialist was entered for you, please call us  in 2 weeks if you have not heard from the specialist office to schedule.

## 2024-02-11 NOTE — Progress Notes (Signed)
 "    Patient ID: Norma Cooke, female  DOB: 2000-09-01, 24 y.o.   MRN: [id number] Patient Care Team    Relationship Specialty Notifications Start End  Catherine Charlies LABOR, DO PCP - General Family Medicine  09/28/21   Marquette Ozell BIRCH, DO Referring Physician Sports Medicine  09/28/21   Glendia Simmonds, OD Referring Physician Optometry  09/28/21   Pocock, Kristyn M, MD Referring Physician Neurology  09/28/21   Jeneal Danita Macintosh, MD Consulting Physician Allergy   09/28/21   Abran Glendale FALCON, MD (Inactive) Consulting Physician Pediatrics  09/28/21     Chief Complaint  Patient presents with   ADHD    Wanting to discuss dosage of Vyvanse .     Subjective: Norma Cooke is a 24 y.o.  female present for [facility] follow up ADHD/anxiety: Patient reports Vyvanse  40 mg daily is working well the first half of the day, but wears out too soon for her to complete her classes and studies along with homework.  She states that her junior year has become much more complicated and the workload has increased, she is finding it more difficult to keep up with the pace with her ADHD meds running out too soon in the day. She states the Zoloft  200 mg is still working well for her. Prior note: She reports compliance with Vyvanse  40 mg daily and would like to continue.  This medication was changed from methylphenidate  20 mg by another provider in [place] .  She has settled into college in [place] .  She was desiring  to have her medications locally and her family will pick up and mailed to her.  However, she did see the provider in [place]  now.  Unfortunately, she will not be able to get back to [place]  to see that provider again until school starts back up in the fall. Zoloft  100 mg BID for anxiety and methylphenidate  are working well for her Prior note: She reports she has had multiple concussions since 2013.  She is established with neurology who prescribes Imitrex, Aimovig for her history of  postconcussion syndrome and chronic headaches.  She plans to remain established with neurology for management.       06/25/2023    1:27 PM 07/05/2022    3:48 PM 11/26/2021   12:55 PM 06/12/2021    2:36 PM  Depression screen PHQ 2/9  Decreased Interest 0 0 0 0  Down, Depressed, Hopeless 0 0 0 0  PHQ - 2 Score 0 0 0 0  Altered sleeping  0  0  Tired, decreased energy  0  0  Change in appetite  0  0  Feeling bad or failure about yourself   0  0  Trouble concentrating  0  0  Moving slowly or fidgety/restless  0  0  Suicidal thoughts  0    PHQ-9 Score  0   0   Difficult doing work/chores  Not difficult at all       Data saved with a previous flowsheet row definition      07/05/2022    3:48 PM 06/12/2021    2:36 PM  GAD 7 : Generalized Anxiety Score  Nervous, Anxious, on Edge 0 0  Control/stop worrying 0 0  Worry too much - different things  0  Trouble relaxing 0 0  Restless 0 0  Easily annoyed or irritable 0 0  Afraid - awful might happen 0 0  Total GAD 7 Score  0  Anxiety Difficulty Not difficult at all  06/25/2023    1:27 PM 07/05/2022    3:48 PM 11/26/2021   12:54 PM  Fall Risk   Falls in the past year? 0 0 0  Number falls in past yr: 0 0 0  Injury with Fall? 0  0  0   Risk for fall due to : No Fall Risks No Fall Risks   Follow up Falls evaluation completed Falls evaluation completed Falls evaluation completed     Data saved with a previous flowsheet row definition     Immunization History  Administered Date(s) Administered   DTaP 11/02/2002, 12/27/2002, 02/28/2003, 12/05/2003, 10/30/2007   HIB (PRP-OMP) 11/02/2002, 12/27/2002, 02/28/2003, 12/05/2003   HPV 9-valent 09/27/2013, 10/05/2014   Hepatitis A 08/26/2005, 08/25/2006   Hepatitis B 04-19-2002, 09/29/2002, 05/25/2003   IPV 11/02/2002, 12/27/2002, 05/25/2003, 10/30/2007   Influenza,inj,Quad PF,6+ Mos 09/28/2021   Influenza-Unspecified 11/05/2022   MMR 09/02/2003, 10/30/2007   Meningococcal  Conjugate 09/27/2013, 11/06/2018   Novel Infuenza-h1n1-09 12/16/2007, 01/25/2008   PFIZER(Purple Top)SARS-COV-2 Vaccination 04/08/2019, 05/01/2019, 01/24/2020   Pfizer Covid-19 Vaccine Bivalent Booster 87yrs & up 10/13/2020   Pfizer(Comirnaty)Fall Seasonal Vaccine 12 years and older 10/24/2023   Pneumococcal Conjugate-13 11/02/2002, 12/27/2002, 02/28/2003, 09/02/2003   Tdap 09/27/2013   Varicella 09/02/2003, 10/30/2007    No results found.  Past Medical History:  Diagnosis Date   ADHD    Adolescent idiopathic scoliosis of thoracolumbar spine 05/31/2019   Allergies    Dysfunction of both eustachian tubes 08/16/2021   Frequent headaches    Gastroesophageal reflux disease 12/02/2019   Migraine without aura and with status migrainosus, not intractable 03/07/2014   Odynophagia 12/13/2019   Telangiectasia 07/05/2021   Allergies  Allergen Reactions   Other Other (See Comments)   Pollen Extract Other (See Comments)   History reviewed. No pertinent surgical history.  Family History  Problem Relation Age of Onset   Hyperlipidemia Mother    Migraines Mother    Hearing loss Mother    Miscarriages / Stillbirths Mother    Hyperlipidemia Father    Stroke Maternal Grandmother    Hyperlipidemia Maternal Grandmother    Cancer - Colon Maternal Grandfather    Hearing loss Maternal Grandfather    Arthritis Maternal Grandfather    GER disease Maternal Grandfather    Early death Paternal Grandfather    Depression Other    Schizophrenia Other    Depression Other    Social History   Social History Narrative   Marital status/children/pets: Single   Education/employment: attends college-student   Safety:      -smoke alarm in the home:Yes     - wears seatbelt: Yes     - Feels safe in their relationships: Yes       Allergies as of 02/11/2024       Reactions   Other Other (See Comments)   Pollen Extract Other (See Comments)        Medication List        Accurate as of  February 11, 2024  4:43 PM. If you have any questions, ask your nurse or doctor.          amphetamine -dextroamphetamine  10 MG tablet Commonly known as: ADDERALL 1 tab PO around 2 pm Started by: Jillianne Gamino, DO   amphetamine -dextroamphetamine  10 MG tablet Commonly known as: ADDERALL 1 tab PO around 2 pm Start taking on: April 21, 2024 Started by: Charlies Bellini, DO   Botox 200 units injection Generic drug: botulinum toxin Type A   cetirizine 10 MG tablet Commonly known  as: ZYRTEC Take 10 mg by mouth in the morning and at bedtime.   fluticasone  50 MCG/ACT nasal spray Commonly known as: FLONASE  Place into both nostrils daily.   ketoconazole 2 % shampoo Commonly known as: NIZORAL Use as a shampoo for the scalp Externally Once a day until clear, then as needed. Let sit for 3-5 minutes before rinsing.; Duration: 30 days   lisdexamfetamine  40 MG capsule Commonly known as: VYVANSE  Take 1 capsule (40 mg total) by mouth daily. What changed: Another medication with the same name was changed. Make sure you understand how and when to take each. Changed by: Charlies Bellini, DO   lisdexamfetamine  40 MG capsule Commonly known as: Vyvanse  Take 1 capsule (40 mg total) by mouth every morning. Start taking on: April 21, 2024 What changed: These instructions start on April 21, 2024. If you are unsure what to do until then, ask your doctor or other care provider. Changed by: Charlies Bellini, DO   naproxen sodium 220 MG tablet Commonly known as: ALEVE Take 550 mg by mouth daily as needed.   Nurtec 75 MG Tbdp Generic drug: Rimegepant Sulfate Take by mouth as needed.   ondansetron 4 MG disintegrating tablet Commonly known as: ZOFRAN-ODT Take 4 mg by mouth 2 (two) times daily as needed for nausea or vomiting.   Qulipta 60 MG Tabs Generic drug: Atogepant Take by mouth.   ropivacaine (PF) 5 mg/mL (0.5%) 5 MG/ML injection Commonly known as: NAROPIN 10 mg by Other route.   sertraline  100 MG  tablet Commonly known as: Zoloft  Take 1 tablet (100 mg total) by mouth 2 (two) times daily.   SUMAtriptan 100 MG tablet Commonly known as: IMITREX Take 100 mg by mouth.   TRETINOIN EX Apply topically.        All past medical history, surgical history, allergies, family history, immunizations andmedications were updated in the EMR today and reviewed under the history and medication portions of their EMR.    No results found for this or any previous visit (from the past 2160 hours).  No results found.   ROS 14 pt review of systems performed and negative (unless mentioned in an HPI)  Objective: BP 104/74   Pulse (!) 108   Temp 98.1 F (36.7 C)   Wt 119 lb 12.8 oz (54.3 kg)   SpO2 99%   BMI 18.76 kg/m  Physical Exam Vitals and nursing note reviewed.  Constitutional:      General: She is not in acute distress.    Appearance: Normal appearance. She is normal weight. She is not ill-appearing or toxic-appearing.     Comments: No longer tachycardic on exam  Eyes:     Extraocular Movements: Extraocular movements intact.     Conjunctiva/sclera: Conjunctivae normal.     Pupils: Pupils are equal, round, and reactive to light.  Cardiovascular:     Rate and Rhythm: Normal rate and regular rhythm.     Heart sounds: No murmur heard. Neurological:     Mental Status: She is alert and oriented to person, place, and time. Mental status is at baseline.  Psychiatric:        Mood and Affect: Mood normal.        Behavior: Behavior normal.        Thought Content: Thought content normal.        Judgment: Judgment normal.      Assessment/plan: VENEZIA SARGEANT is a 24 y.o. female present for  Attention deficit hyperactivity disorder (ADHD), predominantly inattentive  type [place]  controlled substance database reviewed 02/11/2024 Continue Vyvanse  40 mg daily#90, x2 scripts Added Adderall 10 mg short acting to take around 2 PM daily-with caution on not taking too late and ordered  to be able to fall asleep that evening. -Patient made aware that controlled substances cannot be transferred between pharmacies.   - Patient was made aware she will either need to receive her care here or in [place] , but not both, concerning her controlled substance prescriptions.  Patient reports understanding.  Anxiety: Stable Continue Zoloft  100 mg BID.    Postconcussion syndrome/Chronic post-traumatic headache, not intractable Continue follow-up with neurology who prescribes Aimovig and Imitrex.  Return in about 24 weeks (around 07/28/2024) for Routine chronic condition follow-up.  No orders of the defined types were placed in this encounter.  Meds ordered this encounter  Medications   lisdexamfetamine  (VYVANSE ) 40 MG capsule    Sig: Take 1 capsule (40 mg total) by mouth daily.    Dispense:  90 capsule    Refill:  0   lisdexamfetamine  (VYVANSE ) 40 MG capsule    Sig: Take 1 capsule (40 mg total) by mouth every morning.    Dispense:  90 capsule    Refill:  0   amphetamine -dextroamphetamine  (ADDERALL) 10 MG tablet    Sig: 1 tab PO around 2 pm    Dispense:  90 tablet    Refill:  0   amphetamine -dextroamphetamine  (ADDERALL) 10 MG tablet    Sig: 1 tab PO around 2 pm    Dispense:  90 tablet    Refill:  0   sertraline  (ZOLOFT ) 100 MG tablet    Sig: Take 1 tablet (100 mg total) by mouth 2 (two) times daily.    Dispense:  180 tablet    Refill:  1   Referral Orders  No referral(s) requested today     Note is dictated utilizing voice recognition software. Although note has been proof read prior to signing, occasional typographical errors still can be missed. If any questions arise, please do not hesitate to call for verification.  Electronically signed by: Charlies Bellini, DO [facility] "

## 2024-07-06 ENCOUNTER — Ambulatory Visit: Admitting: Family Medicine
